# Patient Record
Sex: Female | Born: 2005 | Race: White | Hispanic: No | Marital: Single
Health system: Southern US, Community
[De-identification: ages and names within clinical notes are randomized; demographics above are authoritative.]

## PROBLEM LIST (undated history)

## (undated) DIAGNOSIS — R630 Anorexia: Secondary | ICD-10-CM

## (undated) DIAGNOSIS — K5900 Constipation, unspecified: Secondary | ICD-10-CM

## (undated) HISTORY — DX: Constipation, unspecified: K59.00

## (undated) HISTORY — DX: Anorexia: R63.0

---
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
## 2024-01-19 ENCOUNTER — Ambulatory Visit: Payer: Medicaid Other | Admitting: Family Medicine

## 2024-01-19 ENCOUNTER — Ambulatory Visit (INDEPENDENT_AMBULATORY_CARE_PROVIDER_SITE_OTHER): Payer: Medicaid Other | Admitting: Student

## 2024-01-19 VITALS — BP 98/70 | HR 86 | Ht 59.0 in | Wt 106.2 lb

## 2024-01-19 DIAGNOSIS — N946 Dysmenorrhea, unspecified: Secondary | ICD-10-CM | POA: Diagnosis not present

## 2024-01-19 NOTE — Progress Notes (Signed)
  SUBJECTIVE:   CHIEF COMPLAINT / HPI:   Menstrual Pain -Seen 12/19/23 > Tyl 1000 mg TID, Ibuprofen 600 mg q6h  Is wanting some pain medicine that helps her sleep. Still having painful periods and has taken some tylenol/ibuprofen but unsure how much. Did not take repeat doses or large doses (doesn't remember taking more than one pill). Periods are otherwise regular and  will last 7-8 days. Pain last the first 2 days, and is intense and hard for her to deal with.     PERTINENT  PMH / PSH:    OBJECTIVE:  There were no vitals taken for this visit. Physical Exam Constitutional:      General: She is not in acute distress.    Appearance: Normal appearance. She is not ill-appearing.  Cardiovascular:     Rate and Rhythm: Normal rate and regular rhythm.     Pulses: Normal pulses.     Heart sounds: No murmur heard.    No friction rub. No gallop.  Pulmonary:     Effort: Pulmonary effort is normal. No respiratory distress.     Breath sounds: Normal breath sounds. No stridor. No wheezing, rhonchi or rales.  Abdominal:     General: Abdomen is flat. Bowel sounds are normal. There is no distension.     Palpations: Abdomen is soft. There is no mass.     Tenderness: There is no abdominal tenderness. There is no guarding or rebound.     Hernia: No hernia is present.  Neurological:     Mental Status: She is alert.  Psychiatric:        Mood and Affect: Mood normal.        Behavior: Behavior normal.      ASSESSMENT/PLAN:   Assessment & Plan Dysmenorrhea Patient comes for follow-up of her menstrual cycle pain.  Patient reports that she is continued continues to have menstrual cycle pain for the first 2 days of her cycle.  Patient reports her cycle is regular, and last 7 to 8 days.  Patient reports she tried some Motrin or Tylenol but is unsure how much and did not repeat doses.  Will recommend patient tries higher dose of Tylenol, and ibuprofen, with instructions on how to take and dose. -  Ibuprofen 800 mg every 8 hours - Tylenol 1000 mg every 8 hours - Stagger dosing by 4 hours No follow-ups on file. Bess Kinds, MD 01/19/2024, 7:12 AM PGY-3, [facility]

## 2024-01-19 NOTE — Patient Instructions (Addendum)
It was great to see you! Thank you for allowing me to participate in your care!  I recommend that you always bring your medications to each appointment as this makes it easy to ensure we are on the correct medications and helps [place] not miss when refills are needed.  Our plans for today:  Menstrual Pain Continue Tylenol 1000 mg (500 mg x 2) every 8 hours Continue Ibuprofen 800 mg (200 mg x 4) every 8 hours  Take Ibuprofen first, and then 4 hours later take the Tylenol  After 8 hours can repeat ibuprofen  After 8 hours can repeat  This way you will have pain medicine every 4 hours Regimen will go like this: Take Ibuprofen > 4 hours later Tylenol > 4 hours later Ibuprofen > 4 hours later Tylenol  Take care and seek immediate care sooner if you develop any concerns.   Dr. Bess Kinds, MD [facility]    ???? ??? ???? ???! ???? ?? ????? ?? ?? ????? ????? ?? ?? ?????? ??? ?????? ???!  ?? ????? ????? ?? ??? ????? ????? ??? ??? ?? ?? ?? ?????? ??????? ???? ??? ??? ????? ??? ?? ????? ?? ????? ??? ???? ?? ???? ?????? ???? ?????? ? ?? ?? ??? ????? ?? ??????? ???? ?? ?? ???? ???? ???? ?? ??? ?????.  ?????? ??? ?? ???? ?????:  ??? ???? ?????? ??????? 1000 ???? ??? (500 ???? ??? x 2) ?? ?? 8 ???? ????? ???? ?????????? 800 ???? ??? (200 ???? ??? x 4) ?? ?? 8 ???? ????? ????  ???? ?????????? ???? ????? ? ??? 4 ???? ??? ??????? ???? ????  ??? ?? ? ???? ?????? ?????????? ?? ????? ???  ??? ?? ? ???? ???? ????? ???   ?? ??? ???? ??? ?? 4 ???? ????? ??? ?????? ????  ???? ??????? ????? ???: ?????????? > 4 ???? ??? ??????? > 4 ???? ??? ?????????? > 4 ???? ??? ??????? ???? ????  ????? ????? ? ??? ??? ?? ??? ?????? ?? ????? ??????? ????? ?? ????? ?????? ???? ?????.   ????? ?????? ????? MD ????? ???????? ?????

## 2024-01-19 NOTE — Assessment & Plan Note (Addendum)
Patient comes for follow-up of her menstrual cycle pain.  Patient reports that she is continued continues to have menstrual cycle pain for the first 2 days of her cycle.  Patient reports her cycle is regular, and last 7 to 8 days.  Patient reports she tried some Motrin or Tylenol but is unsure how much and did not repeat doses.  Will recommend patient tries higher dose of Tylenol, and ibuprofen, with instructions on how to take and dose. - Ibuprofen 800 mg every 8 hours - Tylenol 1000 mg every 8 hours - Stagger dosing by 4 hours

## 2024-02-09 DIAGNOSIS — H5213 Myopia, bilateral: Secondary | ICD-10-CM | POA: Diagnosis not present

## 2024-03-31 DIAGNOSIS — H6123 Impacted cerumen, bilateral: Secondary | ICD-10-CM | POA: Diagnosis not present

## 2024-03-31 DIAGNOSIS — H900 Conductive hearing loss, bilateral: Secondary | ICD-10-CM | POA: Diagnosis not present

## 2024-10-08 ENCOUNTER — Ambulatory Visit: Admitting: Family Medicine

## 2024-10-08 VITALS — BP 108/75 | HR 95 | Ht 58.66 in | Wt 101.8 lb

## 2024-10-08 DIAGNOSIS — R300 Dysuria: Secondary | ICD-10-CM

## 2024-10-08 DIAGNOSIS — K5900 Constipation, unspecified: Secondary | ICD-10-CM

## 2024-10-08 LAB — POCT URINALYSIS DIP (MANUAL ENTRY)
Bilirubin, UA: NEGATIVE
Glucose, UA: NEGATIVE mg/dL
Ketones, POC UA: NEGATIVE mg/dL
Leukocytes, UA: NEGATIVE
Nitrite, UA: NEGATIVE
Spec Grav, UA: 1.02 (ref 1.010–1.025)
Urobilinogen, UA: 0.2 U/dL
pH, UA: 5.5 (ref 5.0–8.0)

## 2024-10-08 MED ORDER — POLYETHYLENE GLYCOL 3350 17 GM/SCOOP PO POWD: 17.0000 g | Freq: Every day | ORAL | 0 refills | Status: DC | PRN

## 2024-10-08 NOTE — Assessment & Plan Note (Signed)
 Advised daily MiraLAX - start with 1 scoop per day, can increase to more scoops daily until she is having soft bowel movement. Also advised increasing fiber content in her diet.   Can a follow-up appointment in a few weeks if daily MiraLAX  is not working.

## 2024-10-08 NOTE — Patient Instructions (Addendum)
 Thank you for coming in today! Here is a summary of what we discussed:  The best way to help your constipation is to eat foods with fiber and use the MiraLAX  every day.  This is a very safe medicine that works well! You can start with 1 scoop mixed with water or another liquid every morning morning.  Give it a few days and if is not working you can always increase to more scoops a day until you are having a soft bowel movement every day.  If you feel that your symptoms are not getting better in the next few weeks, please make a follow-up appointment and we can discuss other treatments.  We are checking some labs today. If they are abnormal, I will call you. If they are normal, I will send you a MyChart message (if it is active) or a letter in the mail. If you do not hear about your labs in the next 2 weeks, please call the office.   Please call the clinic at [phone] if your symptoms worsen or you have any concerns.  Best, Dr Adele

## 2024-10-08 NOTE — Progress Notes (Signed)
    SUBJECTIVE:   CHIEF COMPLAINT / HPI:   Constipation --States has been bothering her more for the last 2 weeks --has been a chronic issue upon chart review --Using MiraLAX  2-3 scoops once per week -- States she does not eat much fiber in her diet --Bothered by abdominal pain --drinks plenty of water --Denies nausea, vomiting, or diarrhea -- Also reports possible dysuria  PERTINENT  PMH / PSH: Dysmenorrhea, latent TB  OBJECTIVE:   BP 108/75   Pulse 95   Ht 4' 10.66 (1.49 m)   Wt 101 lb 12.8 oz (46.2 kg)   SpO2 100%   BMI 20.80 kg/m   - General: No acute distress. Awake and conversant. - Eyes: Normal conjunctiva, anicteric. Round symmetric pupils. - ENT: Hearing grossly intact. No nasal discharge. - Neck: Neck is supple. No masses or thyromegaly. - Respiratory: Respirations are non-labored. - Skin: No visible rashes or ulcers. - Psych: Alert and oriented. Cooperative, Appropriate mood and affect, Normal judgment. - MSK: Normal ambulation. - Neuro: Sensation and CN II-XII grossly normal.   ASSESSMENT/PLAN:   Assessment & Plan Constipation, unspecified constipation type Advised daily MiraLAX - start with 1 scoop per day, can increase to more scoops daily until she is having soft bowel movement. Also advised increasing fiber content in her diet.   Can a follow-up appointment in a few weeks if daily MiraLAX  is not working. Dysuria UA negative for UTI. Suspect abd pain is due to constipation.     Rea Raring, MD [facility]

## 2024-11-05 ENCOUNTER — Ambulatory Visit: Admitting: Family Medicine

## 2024-11-05 VITALS — BP 107/77 | HR 94 | Ht 59.0 in | Wt 105.4 lb

## 2024-11-05 DIAGNOSIS — K649 Unspecified hemorrhoids: Secondary | ICD-10-CM

## 2024-11-05 DIAGNOSIS — K5900 Constipation, unspecified: Secondary | ICD-10-CM

## 2024-11-05 MED ORDER — POLYETHYLENE GLYCOL 3350 17 GM/SCOOP PO POWD: 17.0000 g | Freq: Every day | ORAL | 2 refills | Status: AC | PRN

## 2024-11-05 MED ORDER — PREPARATION H 0.25-14-74.9 % RE OINT: 1.0000 | TOPICAL_OINTMENT | Freq: Two times a day (BID) | RECTAL | 1 refills | Status: AC | PRN

## 2024-11-05 NOTE — Patient Instructions (Signed)
 Thank you for coming in today! Here is a summary of what we discussed:  Keep using the miralax  daily- you can keep increasing the number of scoops per day until you are having softer bowel movements. Try to add more fiber in your diet- see the attached list for ideas  You can use the cream on your bottom twice a day for hemorrhoids. Please let me know if this does not help or if it gets worse  Make a follow up appointment in a few weeks if needed  Please call the clinic at [phone] if your symptoms worsen or you have any concerns.  Best, Dr Adele

## 2024-11-05 NOTE — Progress Notes (Signed)
    SUBJECTIVE:   CHIEF COMPLAINT / HPI:   F/u constipation --saw on 11/7, advised daily Miralax  and increasing fiber intake --has been using 2 scoops miralax  daily for a few days --reports 1-2 hard bowel movements daily --reports small amount of blood on the toilet paper --has some itching and pain in her bottom   PERTINENT  PMH / PSH: reviewed  OBJECTIVE:   BP 107/77   Pulse 94   Ht 4' 11 (1.499 m)   Wt 105 lb 6.4 oz (47.8 kg)   SpO2 99%   BMI 21.29 kg/m   - General: No acute distress. Awake and conversant. - Eyes: Normal conjunctiva, anicteric. Round symmetric pupils. - ENT: Hearing grossly intact. No nasal discharge. - Neck: Neck is supple. No masses or thyromegaly. - Respiratory: Respirations are non-labored. - Skin: No visible rashes or ulcers. - Psych: Alert and oriented. Cooperative, Appropriate mood and affect, Normal judgment. - MSK: Normal ambulation. - Neuro: Sensation and CN II-XII grossly normal.   ASSESSMENT/PLAN:   Assessment & Plan Hemorrhoids, unspecified hemorrhoid type Constipation, unspecified constipation type Improvement in symptoms since last visit, still reporting hard stools and sounds like she may have a hemorrhoid. Advised continuing Miralax  (increasing quantity as needed) and using preparation H ointment. Reviewed high fiber diet recommendations. Advised pt to return in a few weeks if sx persist. - phenylephrine-shark liver oil-mineral oil-petrolatum (PREPARATION H) 0.25-14-74.9 % rectal ointment; Place 1 Application rectally 2 (two) times daily as needed for hemorrhoids.  Dispense: 56 g; Refill: 1 - polyethylene glycol powder (MIRALAX ) 17 GM/SCOOP powder; Take 17 g by mouth daily as needed (constipation).  Dispense: 578 g; Refill: 2     Rea Raring, MD [facility]

## 2024-11-05 NOTE — Assessment & Plan Note (Signed)
 Improvement in symptoms since last visit, still reporting hard stools and sounds like she may have a hemorrhoid. Advised continuing Miralax  (increasing quantity as needed) and using preparation H ointment. Reviewed high fiber diet recommendations. Advised pt to return in a few weeks if sx persist. - phenylephrine-shark liver oil-mineral oil-petrolatum (PREPARATION H) 0.25-14-74.9 % rectal ointment; Place 1 Application rectally 2 (two) times daily as needed for hemorrhoids.  Dispense: 56 g; Refill: 1 - polyethylene glycol powder (MIRALAX ) 17 GM/SCOOP powder; Take 17 g by mouth daily as needed (constipation).  Dispense: 578 g; Refill: 2
